# Patient Record
Sex: Female | Born: 1975 | Race: White | Hispanic: No | Marital: Married | Smoking: Never smoker
Health system: Southern US, Community
[De-identification: ages and names within clinical notes are randomized; demographics above are authoritative.]

## PROBLEM LIST (undated history)

## (undated) DIAGNOSIS — D649 Anemia, unspecified: Secondary | ICD-10-CM

## (undated) DIAGNOSIS — R079 Chest pain, unspecified: Secondary | ICD-10-CM

## (undated) HISTORY — PX: EYE SURGERY: SHX253

## (undated) HISTORY — DX: Chest pain, unspecified: R07.9

---

## 2015-05-04 ENCOUNTER — Other Ambulatory Visit: Payer: Self-pay | Admitting: Family Medicine

## 2015-05-04 DIAGNOSIS — Z1231 Encounter for screening mammogram for malignant neoplasm of breast: Secondary | ICD-10-CM

## 2015-05-06 ENCOUNTER — Ambulatory Visit: Payer: Self-pay

## 2015-05-07 ENCOUNTER — Ambulatory Visit
Admission: RE | Admit: 2015-05-07 | Discharge: 2015-05-07 | Disposition: A | Discharge status: Home / Self Care | Payer: BLUE CROSS/BLUE SHIELD | Source: Ambulatory Visit | Attending: Family Medicine | Admitting: Family Medicine

## 2015-05-07 DIAGNOSIS — Z1231 Encounter for screening mammogram for malignant neoplasm of breast: Secondary | ICD-10-CM | POA: Insufficient documentation

## 2015-08-01 ENCOUNTER — Encounter: Payer: Self-pay | Admitting: Gynecology

## 2015-08-01 ENCOUNTER — Ambulatory Visit
Admission: EM | Admit: 2015-08-01 | Discharge: 2015-08-01 | Disposition: A | Discharge status: Home / Self Care | Payer: BLUE CROSS/BLUE SHIELD | Attending: Family Medicine | Admitting: Family Medicine

## 2015-08-01 DIAGNOSIS — R079 Chest pain, unspecified: Secondary | ICD-10-CM | POA: Insufficient documentation

## 2015-08-01 DIAGNOSIS — R5383 Other fatigue: Secondary | ICD-10-CM | POA: Diagnosis not present

## 2015-08-01 DIAGNOSIS — R531 Weakness: Secondary | ICD-10-CM | POA: Diagnosis present

## 2015-08-01 DIAGNOSIS — F419 Anxiety disorder, unspecified: Secondary | ICD-10-CM | POA: Diagnosis not present

## 2015-08-01 DIAGNOSIS — R42 Dizziness and giddiness: Secondary | ICD-10-CM | POA: Diagnosis present

## 2015-08-01 HISTORY — DX: Anemia, unspecified: D64.9

## 2015-08-01 LAB — COMPREHENSIVE METABOLIC PANEL
ALK PHOS: 35 U/L — AB (ref 38–126)
ALT: 55 U/L — ABNORMAL HIGH (ref 14–54)
AST: 57 U/L — AB (ref 15–41)
Albumin: 4.7 g/dL (ref 3.5–5.0)
Anion gap: 13 (ref 5–15)
BILIRUBIN TOTAL: 0.2 mg/dL — AB (ref 0.3–1.2)
BUN: 14 mg/dL (ref 6–20)
CALCIUM: 9.2 mg/dL (ref 8.9–10.3)
CO2: 26 mmol/L (ref 22–32)
Chloride: 100 mmol/L — ABNORMAL LOW (ref 101–111)
Creatinine, Ser: 0.54 mg/dL (ref 0.44–1.00)
GFR calc Af Amer: >60 mL/min (ref >60)
GFR calc non Af Amer: >60 mL/min (ref >60)
GLUCOSE: 100 mg/dL — AB (ref 65–99)
POTASSIUM: 3.6 mmol/L (ref 3.5–5.1)
SODIUM: 139 mmol/L (ref 135–145)
TOTAL PROTEIN: 8.2 g/dL — AB (ref 6.5–8.1)

## 2015-08-01 LAB — CBC WITH DIFFERENTIAL/PLATELET
BASOS ABS: 0 10*3/uL (ref 0–0.1)
Basophils Relative: 0 %
EOS ABS: 0.1 10*3/uL (ref 0–0.7)
Eosinophils Relative: 2 %
HEMATOCRIT: 39.4 % (ref 35.0–47.0)
Hemoglobin: 13 g/dL (ref 12.0–16.0)
Lymphocytes Relative: 30 %
Lymphs Abs: 1.9 10*3/uL (ref 1.0–3.6)
MCH: 26.3 pg (ref 26.0–34.0)
MCHC: 33 g/dL (ref 32.0–36.0)
MCV: 79.9 fL — ABNORMAL LOW (ref 80.0–100.0)
Monocytes Absolute: 0.5 10*3/uL (ref 0.2–0.9)
Monocytes Relative: 7 %
NEUTROS ABS: 3.8 10*3/uL (ref 1.4–6.5)
Neutrophils Relative %: 61 %
Platelets: 268 10*3/uL (ref 150–440)
RBC: 4.93 MIL/uL (ref 3.80–5.20)
RDW: 26.4 % — ABNORMAL HIGH (ref 11.5–14.5)
WBC: 6.3 10*3/uL (ref 3.6–11.0)

## 2015-08-01 NOTE — ED Notes (Signed)
Pt stated feeling faint, weak, dizzy starting last night. Anemic.

## 2015-08-01 NOTE — ED Provider Notes (Signed)
CSN: [id number]     Arrival date & time 08/01/15  1227 History   First MD Initiated Contact with Patient 08/01/15 1353     Chief Complaint  Patient presents with  . Dizziness  . Weakness   (Consider location/radiation/quality/duration/timing/severity/associated sxs/prior Treatment) HPI Comments: 39 yo female with a h/o fatigue, weakness, intermittent dizziness for weeks, intermittent chest pains,  worse last night. States has discussed with PCP recently and per patient discussed possibility of depression/anxiety. Patient states she's been under a lot of stress. Denies fevers, chills, shortness of breath, rash.   Patient is a 39 y.o. female presenting with dizziness and weakness. The history is provided by the patient.  Dizziness Associated symptoms: weakness   Weakness    Past Medical History  Diagnosis Date  . Anemia    Past Surgical History  Procedure Laterality Date  . Cesarean section      2    No family history on file. Social History  Substance Use Topics  . Smoking status: Never Smoker   . Smokeless tobacco: None  . Alcohol Use: No   OB History    No data available     Review of Systems  Neurological: Positive for dizziness and weakness.    Allergies  Zithromax  Home Medications   Prior to Admission medications   Medication Sig Start Date End Date Taking? Authorizing Payton Moder  IRON CR PO Take by mouth.   Yes Historical Maricruz Lucero, MD   Meds Ordered and Administered this Visit  Medications - No data to display  BP 151/93 mmHg  Pulse 75  Temp(Src) 98.6 F (37 C) (Tympanic)  Ht  (1.651 m)  Wt 140 lb (63.504 kg)  BMI 23.30 kg/m2  SpO2 100%  LMP 07/25/2015 Orthostatic VS for the past 24 hrs:  BP- Lying Pulse- Lying BP- Sitting Pulse- Sitting BP- Standing at 0 minutes Pulse- Standing at 0 minutes  08/01/15 1357 (!) 143/93 mmHg 71 (!) 154/94 mmHg 68 (!) 131/97 mmHg 77    Physical Exam  Constitutional: She appears well-developed and  well-nourished. No distress.  HENT:  Head: Normocephalic.  Right Ear: Tympanic membrane, external ear and ear canal normal.  Left Ear: Tympanic membrane, external ear and ear canal normal.  Nose: Nose normal.  Mouth/Throat: Oropharynx is clear and moist and mucous membranes are normal.  Eyes: Conjunctivae and EOM are normal. Pupils are equal, round, and reactive to light. Right eye exhibits no discharge. Left eye exhibits no discharge. No scleral icterus.  Neck: Normal range of motion. Neck supple. No JVD present. No tracheal deviation present. No thyromegaly present.  Cardiovascular: Normal rate, regular rhythm, normal heart sounds and intact distal pulses.   No murmur heard. Pulmonary/Chest: Effort normal and breath sounds normal. No stridor. No respiratory distress. She has no wheezes. She has no rales. She exhibits no tenderness.  Musculoskeletal: She exhibits no edema.  Lymphadenopathy:    She has no cervical adenopathy.  Neurological: She is alert.  Skin: No rash noted. She is not diaphoretic.  Vitals reviewed.   ED Course  Procedures (including critical care time)  Labs Review Labs Reviewed  CBC WITH DIFFERENTIAL/PLATELET - Abnormal; Notable for the following:    MCV 79.9 (*)    RDW 26.4 (*)    All other components within normal limits  COMPREHENSIVE METABOLIC PANEL - Abnormal; Notable for the following:    Chloride 100 (*)    Glucose, Bld 100 (*)    Total Protein 8.2 (*)  AST 57 (*)    ALT 55 (*)    Alkaline Phosphatase 35 (*)    Total Bilirubin 0.2 (*)    All other components within normal limits    Imaging Review No results found.   Visual Acuity Review  Right Eye Distance:   Left Eye Distance:   Bilateral Distance:    Right Eye Near:   Left Eye Near:    Bilateral Near:      EKG: normal EKG, normal sinus rhythm, there are no previous tracings available for comparison; reviewed by me and agree with printout.   MDM   1. Anxiety   2. Other fatigue     Plan: 1. Test results and diagnosis reviewed with patient 2  Discussed trial of zoloft, however patient refuses; recommend follow up with PCP for further management/discussion of treatment for possible depression/anxietysupportive treatment with  3.. F/u prn    Payton Mccallum, MD 08/01/15 1621

## 2017-08-16 ENCOUNTER — Other Ambulatory Visit: Payer: Self-pay | Admitting: Family Medicine

## 2017-08-16 DIAGNOSIS — Z1231 Encounter for screening mammogram for malignant neoplasm of breast: Secondary | ICD-10-CM

## 2017-09-14 ENCOUNTER — Ambulatory Visit
Admission: RE | Admit: 2017-09-14 | Discharge: 2017-09-14 | Disposition: A | Discharge status: Home / Self Care | Payer: 59 | Source: Ambulatory Visit | Attending: Family Medicine | Admitting: Family Medicine

## 2017-09-14 DIAGNOSIS — Z1231 Encounter for screening mammogram for malignant neoplasm of breast: Secondary | ICD-10-CM | POA: Insufficient documentation

## 2021-07-23 ENCOUNTER — Other Ambulatory Visit: Payer: Self-pay

## 2021-07-23 ENCOUNTER — Ambulatory Visit (LOCAL_COMMUNITY_HEALTH_CENTER): Payer: 59

## 2021-07-23 DIAGNOSIS — R7611 Nonspecific reaction to tuberculin skin test without active tuberculosis: Secondary | ICD-10-CM

## 2021-07-23 NOTE — Progress Notes (Signed)
As no PPD administered, PPDR appt for 07/26/2021 cancelled. Jossie Ng, RN

## 2021-07-26 ENCOUNTER — Other Ambulatory Visit: Payer: Self-pay

## 2021-08-01 ENCOUNTER — Ambulatory Visit (HOSPITAL_COMMUNITY)
Admission: EM | Admit: 2021-08-01 | Discharge: 2021-08-01 | Disposition: A | Discharge status: Home / Self Care | Payer: 59 | Attending: Internal Medicine | Admitting: Internal Medicine

## 2021-08-01 ENCOUNTER — Other Ambulatory Visit: Payer: Self-pay

## 2021-08-01 DIAGNOSIS — Z20822 Contact with and (suspected) exposure to covid-19: Secondary | ICD-10-CM | POA: Insufficient documentation

## 2021-08-01 LAB — SARS CORONAVIRUS 2 (TAT 6-24 HRS): SARS Coronavirus 2: NEGATIVE

## 2021-08-01 NOTE — ED Triage Notes (Signed)
COVID test for travel 

## 2022-02-21 ENCOUNTER — Encounter: Payer: Self-pay | Admitting: Emergency Medicine

## 2022-02-21 ENCOUNTER — Emergency Department
Admission: EM | Admit: 2022-02-21 | Discharge: 2022-02-21 | Discharge status: Against Medical Advice | Payer: 59 | Attending: Emergency Medicine | Admitting: Emergency Medicine

## 2022-02-21 ENCOUNTER — Other Ambulatory Visit: Payer: Self-pay

## 2022-02-21 ENCOUNTER — Ambulatory Visit
Admission: EM | Admit: 2022-02-21 | Discharge: 2022-02-21 | Disposition: A | Discharge status: Home / Self Care | Payer: 59 | Attending: Physician Assistant | Admitting: Physician Assistant

## 2022-02-21 ENCOUNTER — Emergency Department: Payer: 59

## 2022-02-21 DIAGNOSIS — I10 Essential (primary) hypertension: Secondary | ICD-10-CM

## 2022-02-21 DIAGNOSIS — R079 Chest pain, unspecified: Secondary | ICD-10-CM | POA: Diagnosis not present

## 2022-02-21 DIAGNOSIS — I161 Hypertensive emergency: Secondary | ICD-10-CM

## 2022-02-21 DIAGNOSIS — R0789 Other chest pain: Secondary | ICD-10-CM | POA: Diagnosis present

## 2022-02-21 DIAGNOSIS — Z5329 Procedure and treatment not carried out because of patient's decision for other reasons: Secondary | ICD-10-CM | POA: Insufficient documentation

## 2022-02-21 LAB — URINALYSIS, ROUTINE W REFLEX MICROSCOPIC
Bilirubin Urine: NEGATIVE
Glucose, UA: NEGATIVE mg/dL
Hgb urine dipstick: NEGATIVE
Ketones, ur: NEGATIVE mg/dL
Leukocytes,Ua: NEGATIVE
Nitrite: NEGATIVE
Protein, ur: NEGATIVE mg/dL
Specific Gravity, Urine: 1.004 — ABNORMAL LOW (ref 1.005–1.030)
pH: 8 (ref 5.0–8.0)

## 2022-02-21 LAB — POC URINE PREG, ED: Preg Test, Ur: NEGATIVE

## 2022-02-21 LAB — CBC
HCT: 40.1 % (ref 36.0–46.0)
Hemoglobin: 12.6 g/dL (ref 12.0–15.0)
MCH: 26.3 pg (ref 26.0–34.0)
MCHC: 31.4 g/dL (ref 30.0–36.0)
MCV: 83.7 fL (ref 80.0–100.0)
Platelets: 260 10*3/uL (ref 150–400)
RBC: 4.79 MIL/uL (ref 3.87–5.11)
RDW: 15.8 % — ABNORMAL HIGH (ref 11.5–15.5)
WBC: 7.3 10*3/uL (ref 4.0–10.5)
nRBC: 0 % (ref 0.0–0.2)

## 2022-02-21 LAB — BASIC METABOLIC PANEL
Anion gap: 9 (ref 5–15)
BUN: 13 mg/dL (ref 6–20)
CO2: 27 mmol/L (ref 22–32)
Calcium: 9.4 mg/dL (ref 8.9–10.3)
Chloride: 104 mmol/L (ref 98–111)
Creatinine, Ser: 0.49 mg/dL (ref 0.44–1.00)
GFR, Estimated: >60 mL/min (ref >60)
Glucose, Bld: 106 mg/dL — ABNORMAL HIGH (ref 70–99)
Potassium: 3.5 mmol/L (ref 3.5–5.1)
Sodium: 140 mmol/L (ref 135–145)

## 2022-02-21 LAB — TROPONIN I (HIGH SENSITIVITY)
Troponin I (High Sensitivity): 6 ng/L (ref <18)
Troponin I (High Sensitivity): 7 ng/L (ref <18)

## 2022-02-21 MED ORDER — AMLODIPINE BESYLATE 5 MG PO TABS
10.0000 mg | ORAL_TABLET | Freq: Once | ORAL | Status: AC
  Administered 2022-02-21: 10 mg via ORAL
  Filled 2022-02-21: qty 2

## 2022-02-21 MED ORDER — AMLODIPINE BESYLATE 10 MG PO TABS: 10.0000 mg | ORAL_TABLET | Freq: Every day | ORAL | 11 refills | Status: DC

## 2022-02-21 MED ORDER — ASPIRIN EC 81 MG PO TBEC: 81.0000 mg | DELAYED_RELEASE_TABLET | Freq: Every day | ORAL | 0 refills | Status: AC

## 2022-02-21 MED ORDER — ASPIRIN 81 MG PO CHEW
324.0000 mg | CHEWABLE_TABLET | Freq: Once | ORAL | Status: AC
  Administered 2022-02-21: 324 mg via ORAL
  Filled 2022-02-21: qty 4

## 2022-02-21 NOTE — ED Provider Notes (Addendum)
? ? ?[facility] ?Provider Note ? ? ? Event Date/Time  ? First MD Initiated Contact with Patient 02/21/22 2044   ?  (approximate) ? ? ?History  ? ?Chest Pain ? ? ?HPI ? ?Misty Glover is a 46 y.o. female with past medical history of hypertension who presents with chest pain.  Patient has had been having chest pain on and off for several months.  Thinks it is brought on by stress.  However today it has been fairly constant.  Described as a burning like sensation in the left axilla and left anterior chest.  Not worse with exertion or inspiration.  Denies associated dyspnea nausea vomiting or diaphoresis.  Denies history of prior heart attack. The patient denies hx of prior DVT/PE, unilateral leg pain/swelling, hormone use, recent surgery, hx of cancer, prolonged immobilization, or hemoptysis.  Patient was told she had high blood pressure several months ago but was not started on medication. ? ?  ? ?Past Medical History:  ?Diagnosis Date  ? Anemia   ? ? ?There are no problems to display for this patient. ? ? ? ?Physical Exam  ?Triage Vital Signs: ?ED Triage Vitals  ?Enc Vitals Group  ?   BP 02/21/22 1922 (!) 230/112  ?   Pulse Rate 02/21/22 1922 79  ?   Resp 02/21/22 1922 20  ?   Temp 02/21/22 1922 98.2 ?F (36.8 ?C)  ?   Temp Source 02/21/22 1922 Oral  ?   SpO2 02/21/22 1922 100 %  ?   Weight 02/21/22 1918 160 lb (72.6 kg)  ?   Height 02/21/22 1918 5\' 5"  (1.651 m)  ?   Head Circumference --   ?   Peak Flow --   ?   Pain Score 02/21/22 1918 2  ?   Pain Loc --   ?   Pain Edu? --   ?   Excl. in [place]? --   ? ? ?Most recent vital signs: ?Vitals:  ? 02/21/22 1922 02/21/22 2145  ?BP: (!) 230/112 (!) 176/101  ?Pulse: 79 64  ?Resp: 20 18  ?Temp: 98.2 ?F (36.8 ?C)   ?SpO2: 100% 98%  ? ? ? ?General: Awake, no distress.  ?CV:  Good peripheral perfusion.  No lower extremity edema ?Resp:  Normal effort.  Lungs are clear ?Abd:  No distention.  ?Neuro:             Awake, Alert, Oriented x 3  ?Other:   ? ? ?ED  Results / Procedures / Treatments  ?Labs ?(all labs ordered are listed, but only abnormal results are displayed) ?Labs Reviewed  ?BASIC METABOLIC PANEL - Abnormal; Notable for the following components:  ?    Result Value  ? Glucose, Bld 106 (*)   ? All other components within normal limits  ?CBC - Abnormal; Notable for the following components:  ? RDW 15.8 (*)   ? All other components within normal limits  ?URINALYSIS, ROUTINE W REFLEX MICROSCOPIC - Abnormal; Notable for the following components:  ? Color, Urine STRAW (*)   ? APPearance CLEAR (*)   ? Specific Gravity, Urine 1.004 (*)   ? All other components within normal limits  ?POC URINE PREG, ED  ?TROPONIN I (HIGH SENSITIVITY)  ?TROPONIN I (HIGH SENSITIVITY)  ? ? ? ?EKG ? ?EKG interpreted by myself, normal sinus rhythm normal axis normal intervals, subtle ST depression in the inferior leads not present on prior EKG ? ? ?RADIOLOGY ?I reviewed the CXR which does  not show any acute cardiopulmonary process; agree with radiology report  ? ? ? ?PROCEDURES: ? ?Critical Care performed:  ? ?Procedures ? ? ?MEDICATIONS ORDERED IN ED: ?Medications  ?aspirin chewable tablet 324 mg (has no administration in time range)  ?amLODipine (NORVASC) tablet 10 mg (has no administration in time range)  ? ? ? ?IMPRESSION / MDM / ASSESSMENT AND PLAN / ED COURSE  ?I reviewed the triage vital signs and the nursing notes. ?             ?               ? ?Differential diagnosis includes, but is not limited to, ACS, hypertensive emergency, GERD, musculoskeletal, pulmonary embolism ?Patient is a 46 year old female is otherwise healthy although does have a prior diagnosis of hypertension has not been treated presenting with chest pain.  Has been having this on and off for several months but is been rather constant today.  Described as a burning-like sensation without associated symptoms.  Seen at urgent care initially referred to the ED.  Initial blood pressure was quite elevated 230/112 rest  of vital signs within normal limits.  She is appears well.  Does have ongoing symptoms.  Her EKG does have some ST depressions in the inferior leads should not present on prior EKG.  Overall her symptoms do not sound cardiac to me however with this abnormal EKG and her significant hypertension do feel like she would benefit from admission for further cardiac work-up.  Initial troponin is negative.  She is not hypoxic or tachycardia my suspicion for PE is low.  Clinically do not suspect aortic dissection. ? ?Patient second troponin is negative.  Repeat blood pressure 170/100.  Had a long discussion with the patient regarding my concern about her EKG elevated blood pressure and ongoing chest pain.  Despite the 2 negative troponins I think she is high risk for cardiac ischemia and I did recommend admission for further work-up.  Patient ultimately did not want to stay.  We discussed risk of completing heart attack at home with worst-case scenario of sudden cardiac death, and she understood.  Discussed with Dr. Graciela Husbands with cardiology in hopes of getting her close follow-up.  He also expressed concern about her significantly elevated blood pressure.  Ultimately decided to leave AMA.  I prescribed her amlodipine and aspirin, she received 324 in the ED and I recommended she started taking a baby aspirin daily.  She plans to call cardiology in the a.m. ?  ? ? ?FINAL CLINICAL IMPRESSION(S) / ED DIAGNOSES  ? ?Final diagnoses:  ?Chest pain, unspecified type  ?Hypertension, unspecified type  ? ? ? ?Rx / DC Orders  ? ?ED Discharge Orders   ? ?      Ordered  ?  amLODipine (NORVASC) 10 MG tablet  Daily       ? 02/21/22 2257  ?  aspirin EC 81 MG tablet  Daily       ? 02/21/22 2257  ? ?  ?  ? ?  ? ? ? ?Note:  This document was prepared using Dragon voice recognition software and may include unintentional dictation errors. ?  ?Georga Hacking, MD ?02/21/22 2300 ? ?  ?Georga Hacking, MD ?02/21/22 2312 ? ?

## 2022-02-21 NOTE — ED Triage Notes (Signed)
Pt presents via POV with complaints of left sided chest pain that has been occurring for the last 2 weeks intermittently but today the pain has been constant. She notes not taking any pain medications but has taken OTC supplements without any improvement. Denies SOB.  ?

## 2022-02-21 NOTE — ED Triage Notes (Signed)
Pt c/o left sided chest pain. Started about 3-4 weeks ago. States it is becoming more frequency and today it has not gone away. She states she can tell the pain is worse when she is at work because the pain typically goes away when she is off work. Denies shortness of breath, sweats, n/v, left arm or jaw pain. She states her pcp told her to come to urgent care. Pt has been taking 2 supplements for HTN, given by a holistic doctor.  ?

## 2022-02-21 NOTE — Discharge Instructions (Signed)

## 2022-02-21 NOTE — ED Notes (Signed)
Patient is being discharged from the Urgent Care and sent to the Emergency Department via POV . Per Adrian Saran, PA, patient is in need of higher level of care due to Chest pain. Patient is aware and verbalizes understanding of plan of care.  ?Vitals:  ? 02/21/22 1656  ?BP: (!) 200/106  ?Pulse: 76  ?Resp: 18  ?Temp: 98.6 ?F (37 ?C)  ?SpO2: 97%  ? ? ?

## 2022-02-21 NOTE — ED Provider Notes (Signed)
?[place] ? ? ? ?CSN: [id number] ?Arrival date & time: 02/21/22  1633 ? ? ?  ? ?History   ?Chief Complaint ?Chief Complaint  ?Patient presents with  ? Chest Pain  ? ? ?HPI ?Misty Glover is a 46 y.o. female presenting for approximately 7 to 8-hour history of left sided chest pressure, aching and burning which she rates at 2/10.  Patient reports it radiates to the axilla.  No radiation of pain to neck, shoulder or arm.  Denies associated numbness, tingling or weakness.  No breathing difficulty or wheezing.  Denies headaches, diaphoresis, nausea/vomiting.  No injuries reported.  Patient reports she has had the symptoms off and on for the past 3 to 4 weeks.  She says she has noticed the symptoms when she has been at work.  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  She says both are stressful.  She has noticed her symptoms will come on when she was stressed or active at work.  Reports on her days off she does not have the symptoms as much except for today when they have came and not gone away.  She says they normally go away.  She does have history of hypertension and states she saw her PCP about 4 months ago when her blood pressure was in the [id number] systolic.  She says her PCP advised she start blood pressure medication but she was not ready to.  Reports she tried to lose weight and change her diet thinking that would help.  Patient has not checked her blood pressure recently.  BP is 200/106 in clinic today.  Patient denies any history of hyperlipidemia or diabetes.  No history of cardiovascular problems.  Denies tobacco or alcohol use.  No other complaints today. ? ?HPI ? ?Past Medical History:  ?Diagnosis Date  ? Anemia   ? ? ?There are no problems to display for this patient. ? ? ?Past Surgical History:  ?Procedure Laterality Date  ? CESAREAN SECTION    ? 2   ? EYE SURGERY    ? ? ?OB History   ?No obstetric history on file. ?  ? ? ? ?Home Medications   ? ?Prior to Admission  medications   ?Medication Sig Start Date End Date Taking? Authorizing Provider  ?IRON CR PO Take by mouth.    [provider]  ? ? ?Family History ?Family History  ?Problem Relation Age of Onset  ? Breast cancer Neg Hx   ? ? ?Social History ?Social History  ? ?Tobacco Use  ? Smoking status: Never  ? Smokeless tobacco: Never  ?Vaping Use  ? Vaping Use: Never used  ?Substance Use Topics  ? Alcohol use: No  ? Drug use: No  ? ? ? ?Allergies   ?Zithromax [azithromycin] ? ? ?Review of Systems ?Review of Systems  ?Constitutional:  Negative for diaphoresis and fatigue.  ?Respiratory:  Negative for cough and shortness of breath.   ?Cardiovascular:  Positive for chest pain. Negative for palpitations and leg swelling.  ?Gastrointestinal:  Negative for abdominal pain, nausea and vomiting.  ?Musculoskeletal:  Negative for back pain and gait problem.  ?Neurological:  Negative for dizziness, syncope, facial asymmetry, speech difficulty, weakness, numbness and headaches.  ?Psychiatric/Behavioral:  The patient is nervous/anxious.   ? ? ?Physical Exam ?Triage Vital Signs ?ED Triage Vitals  ?Enc Vitals Group  ?   BP   ?   Pulse   ?   Resp   ?  Temp   ?   Temp src   ?   SpO2   ?   Weight   ?   Height   ?   Head Circumference   ?   Peak Flow   ?   Pain Score   ?   Pain Loc   ?   Pain Edu?   ?   Excl. in [place]?   ? ?No data found. ? ?Updated Vital Signs ?BP (!) 200/106 (BP Location: Left Arm)   Pulse 76   Temp 98.6 ?F (37 ?C) (Oral)   Resp 18   Ht 5\' 5"  (1.651 m)   Wt 139 lb 15.9 oz (63.5 kg)   SpO2 97%   BMI 23.30 kg/m?  ? ? ? ?Physical Exam ?Vitals and nursing note reviewed.  ?Constitutional:   ?   General: She is not in acute distress. ?   Appearance: Normal appearance. She is not ill-appearing or toxic-appearing.  ?HENT:  ?   Head: Normocephalic and atraumatic.  ?   Nose: Nose normal.  ?   Mouth/Throat:  ?   Mouth: Mucous membranes are moist.  ?   Pharynx: Oropharynx is clear.  ?Eyes:  ?   General: No scleral icterus.     ?   Right eye: No discharge.     ?   Left eye: No discharge.  ?   Conjunctiva/sclera: Conjunctivae normal.  ?Cardiovascular:  ?   Rate and Rhythm: Normal rate and regular rhythm.  ?   Heart sounds: Normal heart sounds.  ?Pulmonary:  ?   Effort: Pulmonary effort is normal. No respiratory distress.  ?   Breath sounds: Normal breath sounds.  ?Musculoskeletal:  ?   Cervical back: Neck supple.  ?Skin: ?   General: Skin is dry.  ?Neurological:  ?   General: No focal deficit present.  ?   Mental Status: She is alert. Mental status is at baseline.  ?   Motor: No weakness.  ?   Gait: Gait normal.  ?Psychiatric:     ?   Mood and Affect: Mood normal.     ?   Behavior: Behavior normal.     ?   Thought Content: Thought content normal.  ? ? ? ?UC Treatments / Results  ?Labs ?(all labs ordered are listed, but only abnormal results are displayed) ?Labs Reviewed - No data to display ? ?EKG ? ? ?Radiology ?No results found. ? ?Procedures ?ED EKG ? ?Date/Time: 02/21/2022 5:40 PM ?Performed by: Danton Clap, PA-C ?Authorized by: Danton Clap, PA-C  ? ?ECG reviewed by ED Physician in the absence of a cardiologist: yes   ?Previous ECG:  ?  Previous ECG:  Unavailable ?Interpretation:  ?  Interpretation: normal   ?Rate:  ?  ECG rate:  72 ?  ECG rate assessment: normal   ?Rhythm:  ?  Rhythm: sinus rhythm   ?Ectopy:  ?  Ectopy: none   ?QRS:  ?  QRS axis:  Normal ?  QRS intervals:  Normal ?  QRS conduction: normal   ?ST segments:  ?  ST segments:  Normal ?T waves:  ?  T waves: normal   ?Comments:  ?   Normal sinus rhythm. Regular rate. (including critical care time) ? ?Medications Ordered in UC ?Medications - No data to display ? ?Initial Impression / Assessment and Plan / UC Course  ?I have reviewed the triage vital signs and the nursing notes. ? ?Pertinent labs & imaging results that were  available during my care of the patient were reviewed by me and considered in my medical decision making (see chart for details). ? ?  ?46 year old  female presenting for left-sided chest pressure and burning sensation which she rates at 2 out of 10 over the past 7 to 8 hours.  Pain has been constant.  It does radiate to the axillary region.  Not associated with numbness, tingling or weakness.  No breathing difficulty, diaphoresis, nausea or vomiting or dizziness.  History of hypertension.  Blood pressure in clinic is 200/106.  Patient reports increased chest discomfort when she is stressed or active at work.  EKG performed today shows normal sinus rhythm and regular rate.  No obvious ST or T wave changes.  Discussed the result with patient.  Her exam is reassuring today as is her EKG but I am very concerned about her blood pressure being so high with the complaint of left-sided chest pain.  Advised her I would feel more comfortable if she were to go to the emergency department to be evaluated at this time and have cardiac enzymes drawn and potentially more imaging and further work-up.  I do not feel comfortable ruling out cardiovascular cause especially given her hypertensive emergency.  Patient seems reluctant to go to ED but says she will have her husband take her.  Does not show interest in EMS transport.  Patient is leaving in stable condition at this time. ? ?Final Clinical Impressions(s) / UC Diagnoses  ? ?Final diagnoses:  ?Chest pain, unspecified type  ?Hypertensive emergency  ? ? ? ?Discharge Instructions   ? ?  ?You have been advised to follow up immediately in the emergency department for concerning signs.symptoms. If you declined EMS transport, please have a family member take you directly to the ED at this time. Do not delay. Based on concerns about condition, if you do not follow up in th e ED, you may risk poor outcomes including worsening of condition, delayed treatment and potentially life threatening issues. If you have declined to go to the ED at this time, you should call your PCP immediately to set up a follow up appointment. ? ?Go to ED  for red flag symptoms, including; fevers you cannot reduce with Tylenol/Motrin, severe headaches, vision changes, numbness/weakness in part of the body, lethargy, confusion, intractable vomiting, severe dehydrat

## 2022-02-21 NOTE — Discharge Instructions (Signed)
Your blood pressure was significantly elevated today.  Please start taking amlodipine daily or what ever medication your cardiologist or primary care provider recommend.  Please start taking 81 mg of aspirin daily. ? ?Call the cardiologist office tomorrow to schedule an appointment.  Please also call your primary care provider to schedule an appointment for discussion of management of your blood pressure. ?

## 2022-02-24 ENCOUNTER — Encounter: Payer: Self-pay | Admitting: Internal Medicine

## 2022-02-24 ENCOUNTER — Ambulatory Visit: Payer: 59 | Admitting: Internal Medicine

## 2022-02-24 VITALS — BP 128/72 | HR 75 | Ht 65.0 in | Wt 161.2 lb

## 2022-02-24 DIAGNOSIS — I10 Essential (primary) hypertension: Secondary | ICD-10-CM

## 2022-02-24 DIAGNOSIS — R072 Precordial pain: Secondary | ICD-10-CM | POA: Diagnosis not present

## 2022-02-24 MED ORDER — AMLODIPINE BESYLATE 5 MG PO TABS: 5.0000 mg | ORAL_TABLET | Freq: Every day | ORAL | 3 refills | Status: AC

## 2022-02-24 MED ORDER — METOPROLOL TARTRATE 100 MG PO TABS: 100.0000 mg | ORAL_TABLET | Freq: Once | ORAL | 0 refills | Status: AC

## 2022-02-24 NOTE — Progress Notes (Signed)
?Cardiology Office Note:   ? ?Date:  02/24/2022  ? ?ID:  Misty Glover, DOB 02/12/1976, MRN [id number] ? ?PCP:  Etheleen Nicks, NP  ? ?[facility] Providers ?Cardiologist:  Alverda Skeans, MD ?Referring MD: Etheleen Nicks, NP  ? ?Chief Complaint/Reason for Referral: Chest pain ? ?ASSESSMENT:   ? ?1. Precordial pain   ?2. Hypertension, unspecified type   ? ? ?PLAN:   ? ?In order of problems listed above: ?We will obtain a coronary CTA to evaluate further.  If the patient has mild obstructive coronary artery disease, they will require a statin (with goal LDL < 70) and aspirin, if they have high-grade disease we will need to consider optimal medical therapy and if symptoms are refractory to medical therapy, then a cardiac catheterization with possible PCI will be pursued to alleviate symptoms.  If they have high risk disease we will proceed directly to cardiac catheterization.  Follow up PRN.  We will stop her aspirin as she has no documented coronary artery disease, diabetes, peripheral vascular disease, or history of stroke. ?2.  Blood pressures well controlled continue Norvasc 5 mg. ? ? ?     ? ?   ? ?Dispo:  Return if symptoms worsen or fail to improve.  ? ?  ? ?Medication Adjustments/Labs and Tests Ordered: ?Current medicines are reviewed at length with the patient today.  Concerns regarding medicines are outlined above. ? ?The following changes have been made:   ? ?Labs/tests ordered: ?Orders Placed This Encounter  ?Procedures  ? CT CORONARY MORPH W/CTA COR W/SCORE W/CA W/CM &/OR WO/CM  ? ? ?Medication Changes: ?Meds ordered this encounter  ?Medications  ? amLODipine (NORVASC) 5 MG tablet  ?  Sig: Take 1 tablet (5 mg total) by mouth daily.  ?  Dispense:  90 tablet  ?  Refill:  3  ? metoprolol tartrate (LOPRESSOR) 100 MG tablet  ?  Sig: Take 1 tablet (100 mg total) by mouth once for 1 dose. Take 90-120 minutes prior to scan.  ?  Dispense:  1 tablet  ?  Refill:  0  ? ? ? ?Current medicines are reviewed at  length with the patient today.  The patient does not have concerns regarding medicines. ? ? ?History of Present Illness:   ? ?FOCUSED PROBLEM LIST:   ?1.  Hypertension ? ?The patient is a 46 y.o. female with the indicated medical history here for chest pain.  Patient presented to [facility] emergency department a few days ago with complaints of several months of variable chest pain brought on by stress.  Apparently they of presentation it was constant and felt like a burning in her chest her left axilla and left anterior chest.  Was not noted to be worse with exertion or inspiration.  EKG, labs, cardiac biomarkers, and chest x-ray are overall reassuring.  She was noted to be quite hypertensive in the emergency department with a blood pressure of over 200.  The patient did leave the emergency department AMA.  She is here for follow-up regarding chest pain and hypertension.  She tells me she gets chest pain when she exerts herself on occasion.  Oftentimes this chest pain is exacerbated by stress at times.  She denies any shortness of breath.  She has had no presyncope or syncope, she is denied any palpitations or paroxysmal nocturnal dyspnea.  She has had no severe bleeding or bruising.  She has never had a stroke.  She denies a family history of coronary artery disease.  She does not drink. ? 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  She feels like this is may be a mistake due to the stress imposed upon her.  She also has some stress related to her marriage as well. ? ?   ?Current Medications: ?Current Meds  ?Medication Sig  ? amLODipine (NORVASC) 5 MG tablet Take 1 tablet (5 mg total) by mouth daily.  ? aspirin EC 81 MG tablet Take 1 tablet (81 mg total) by mouth daily. Swallow whole.  ? IRON CR PO Take by mouth.  ? metoprolol tartrate (LOPRESSOR) 100 MG tablet Take 1 tablet (100 mg total) by mouth once for  1 dose. Take 90-120 minutes prior to scan.  ? [DISCONTINUED] amLODipine (NORVASC) 10 MG tablet Take 1 tablet (10 mg total) by mouth daily.  ?  ? ?Allergies:    ?Zithromax [azithromycin]  ? ?Social History:   ?Social History  ? ?Tobacco Use  ? Smoking status: Never  ? Smokeless tobacco: Never  ?Vaping Use  ? Vaping Use: Never used  ?Substance Use Topics  ? Alcohol use: No  ? Drug use: No  ?  ? ?Family Hx: ?Family History  ?Problem Relation Age of Onset  ? Breast cancer Neg Hx   ?  ? ?Review of Systems:   ?Please see the history of present illness.    ?All other systems reviewed and are negative. ?  ? ? ?EKGs/Labs/Other Test Reviewed:   ? ?EKG:  EKG performed February 21, 2022 that I personally reviewed demonstrates sinus rhythm no acute ST changes or Q waves ? ?Prior CV studies: ?None available ?  ? ?Other studies Reviewed: ?Review of the additional studies/records demonstrates: Chest x-ray demonstrates no acute cardiopulmonary disease ? ?Recent Labs: ?02/21/2022: BUN 13; Creatinine, Ser 0.49; Hemoglobin 12.6; Platelets 260; Potassium 3.5; Sodium 140  ? ?Recent Lipid Panel ?No results found for: CHOL, TRIG, HDL, LDLCALC, LDLDIRECT ? ?Risk Assessment/Calculations:   ? ? ?    ? ?Physical Exam:   ? ?VS:  BP 128/72   Pulse 75   Ht 5\' 5"  (1.651 m)   Wt 161 lb 3.2 oz (73.1 kg)   SpO2 98%   BMI 26.83 kg/m?    ?Wt Readings from Last 3 Encounters:  ?02/24/22 161 lb 3.2 oz (73.1 kg)  ?02/21/22 160 lb (72.6 kg)  ?02/21/22 139 lb 15.9 oz (63.5 kg)  ?  ?GENERAL:  No apparent distress, AOx3 ?HEENT:  No carotid bruits, +2 carotid impulses, no scleral icterus ?CAR: RRR no murmurs, gallops, rubs, or thrills ?RES:  Clear to auscultation bilaterally ?ABD:  Soft, nontender, nondistended, positive bowel sounds x 4 ?VASC:  +2 radial pulses, +2 carotid pulses, palpable pedal pulses ?NEURO:  CN 2-12 grossly intact; motor and sensory grossly intact ?PSYCH:  No active depression or anxiety ?EXT:  No edema, ecchymosis, or  cyanosis ? ?Signed, ?04/23/22, MD  ?02/24/2022 1:33 PM    ?[facility] ?[address] [place], [place], [place]  [place] ?Phone: [phone]; Fax: [phone]  ? ?Note:  This document was prepared using Dragon voice recognition software and may include unintentional dictation errors. ?

## 2022-02-24 NOTE — Patient Instructions (Signed)
Medication Instructions:  ?Your physician has recommended you make the following change in your medication:  ?1.) stop aspirin ?2.) decrease amlodipine (Norvasc) to 5 mg - one tablet daily ? ?*If you need a refill on your cardiac medications before your next appointment, please call your pharmacy* ? ? ?Lab Work: ?none ? ? ?Testing/Procedures: ?Cardiac CTA - see instructions below ? ? ?Follow-Up: ?As needed ? ? ? ? ?Your cardiac CT will be scheduled at  ?[facility] ?[address] ?[place], [place] [address] ?[phone] ? ?Please arrive at the [facility] and Children's Entrance (Entrance C2) of [facility] 30 minutes prior to test start time. ?You can use the FREE valet parking offered at entrance C (encouraged to control the heart rate for the test)  ?Proceed to the [facility] Radiology Department (first floor) to check-in and test prep. ? ?All radiology patients and guests should use entrance C2 at [facility], accessed from [facility], even though the hospital's physical address listed is [address] ? ? ? ?Please follow these instructions carefully (unless otherwise directed): ? ? ?On the Night Before the Test: ?Be sure to Drink plenty of water. ?Do not consume any caffeinated/decaffeinated beverages or chocolate 12 hours prior to your test. ?Do not take any antihistamines 12 hours prior to your test. ? ?On the Day of the Test: ?Drink plenty of water until 1 hour prior to the test. ?Do not eat any food 4 hours prior to the test. ?You may take your regular medications prior to the test.  ?Take metoprolol (Lopressor) two hours prior to test. ?FEMALES- please wear underwire-free bra if available, avoid dresses & tight clothing ? ?     ?After the Test: ?Drink plenty of water. ?After receiving IV contrast, you may experience a mild flushed feeling. This is normal. ?On occasion, you may experience a mild rash up to 24 hours after the test. This is not dangerous. If  this occurs, you can take Benadryl 25 mg and increase your fluid intake. ?If you experience trouble breathing, this can be serious. If it is severe call 911 IMMEDIATELY. If it is mild, please call our office. ?If you take any of these medications: Glipizide/Metformin, Avandament, Glucavance, please do not take 48 hours after completing test unless otherwise instructed. ? ?We will call to schedule your test 2-4 weeks out understanding that some insurance companies will need an authorization prior to the service being performed.  ? ?For non-scheduling related questions, please contact the cardiac imaging nurse navigator should you have any questions/concerns: ?Rockwell Alexandria, Cardiac Imaging Nurse Navigator ?Larey Brick, Cardiac Imaging Nurse Navigator ?[facility] ?Direct Office Dial: [phone]  ? ?For scheduling needs, including cancellations and rescheduling, please call [place], [phone]. ? ?Important Information About Sugar ? ? ? ? ?  ?

## 2022-02-24 NOTE — Telephone Encounter (Signed)
Will route to PharmD to confirm okay for patient to use ashwaganda gummy for stress. ?

## 2022-03-08 ENCOUNTER — Telehealth (HOSPITAL_COMMUNITY): Payer: Self-pay | Admitting: *Deleted

## 2022-03-08 NOTE — Telephone Encounter (Signed)
Reaching out to patient to offer assistance regarding upcoming cardiac imaging study; pt verbalizes understanding of appt date/time, parking situation and where to check in, pre-test NPO status and medications ordered, and verified current allergies; name and call back number provided for further questions should they arise ? ?Briza Bark RN Navigator Cardiac Imaging ?[facility] ?[phone] office ?[phone] cell ? ?Patient to take 100mg metoprolol tartrate two hours prior to her cardiac CT scan.  She is aware to arrive at 1:30pm. ? ?

## 2022-03-09 ENCOUNTER — Ambulatory Visit (HOSPITAL_COMMUNITY)
Admission: RE | Admit: 2022-03-09 | Discharge: 2022-03-09 | Disposition: A | Discharge status: Home / Self Care | Payer: 59 | Source: Ambulatory Visit | Attending: Internal Medicine | Admitting: Internal Medicine

## 2022-03-09 DIAGNOSIS — I10 Essential (primary) hypertension: Secondary | ICD-10-CM | POA: Diagnosis present

## 2022-03-09 DIAGNOSIS — R072 Precordial pain: Secondary | ICD-10-CM | POA: Diagnosis not present

## 2022-03-09 IMAGING — CT CT HEART MORP W/ CTA COR W/ SCORE W/ CA W/CM &/OR W/O CM
4 of 7 series · 8 of 20 positions shown, 9 images · non-contrast
Comparison: Chest two views [DATE]
COMPARISON: Chest two views [DATE]

Addendum:
EXAM:
OVER-READ INTERPRETATION  CT CHEST

The following report is an over-read performed by radiologist Dr.
RANA ADNAN [REDACTED] on [DATE]. This
over-read does not include interpretation of cardiac or coronary
anatomy or pathology. The coronary calcium score/coronary CTA
interpretation by the cardiologist is attached.
TECHNIQUE: The patient was scanned on a Phillips Force scanner.

[Series 6: ts diast sharp · axial · 0.39mm/px · z∈[+1404,+1444]mm · 2 of 301 slices shown]
[im 101/301  lung]
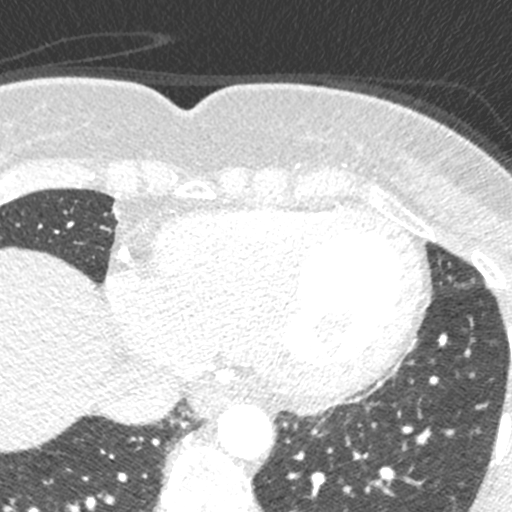
[im 201/301  lung]
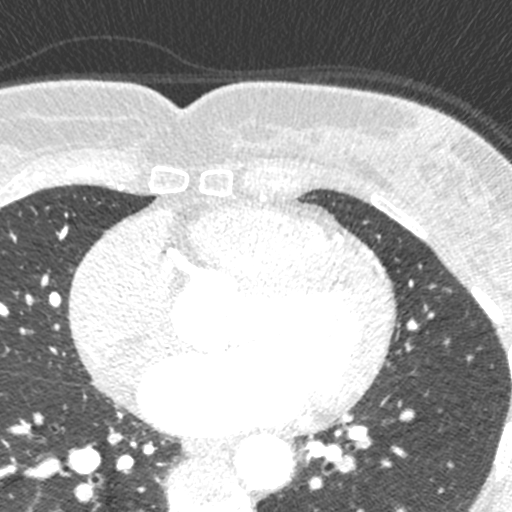

[Series 7: ts syst sharp · axial · 0.39mm/px · z∈[+1404,+1444]mm · 2 of 301 slices shown]
[im 101/301  lung]
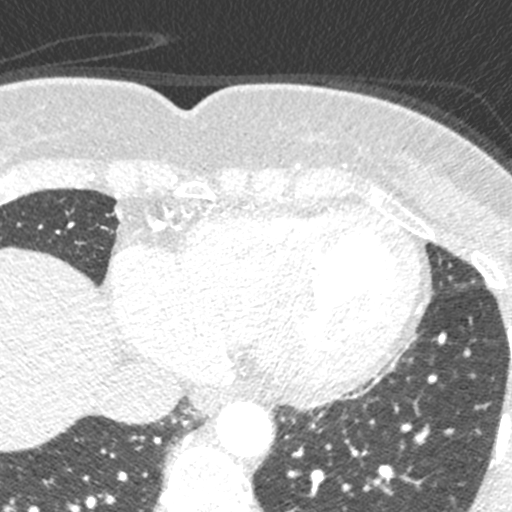
[im 201/301  lung]
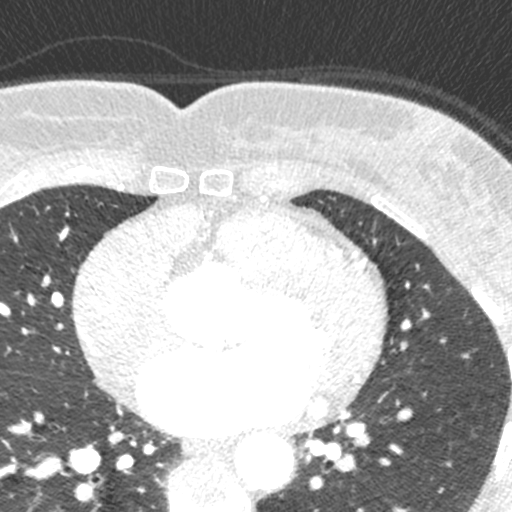

[Series 8: best syst · axial · 0.39mm/px · z∈[+1404,+1444]mm · 2 of 301 slices shown, 3 images]
[im 101/301  vessel]
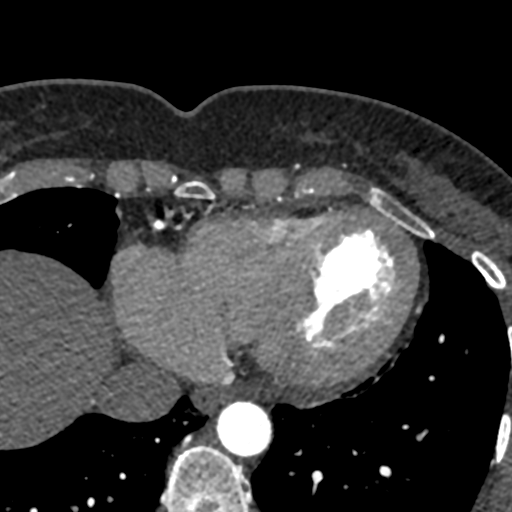
[im 101/301  lung]
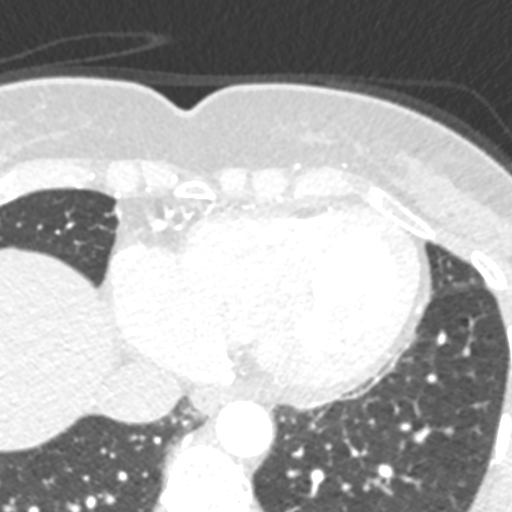
[im 201/301  vessel]
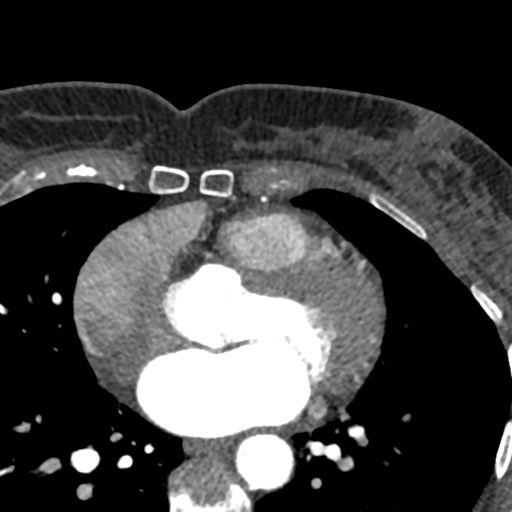

[Series 9: best diast · axial · 0.39mm/px · z∈[+1404,+1444]mm · 2 of 301 slices shown]
[im 101/301  vessel]
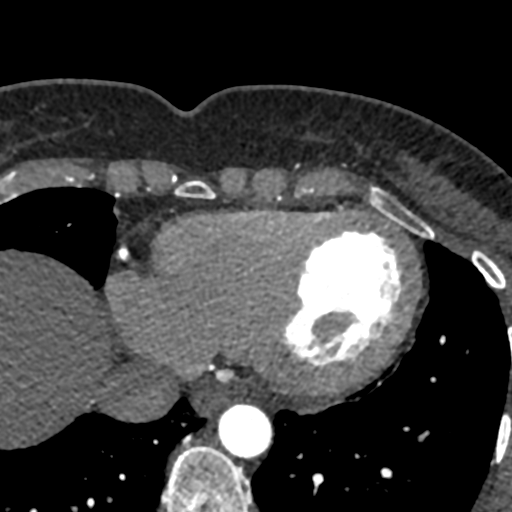
[im 201/301  vessel]
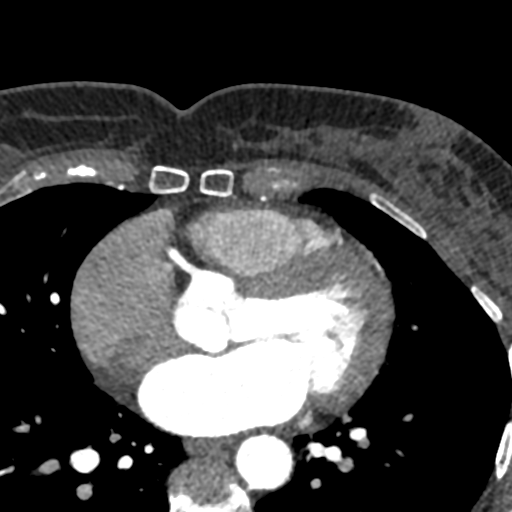

[8 of 20 positions shown; findings below may reference images not displayed]

FINDINGS: Vascular: The proximal ascending aorta measures up to 3.5 cm mildly
dilated but not aneurysmal. No large central pulmonary embolism is
seen within the partially visualized main and right pulmonary
artery.

Mediastinum/Nodes: No mediastinal or hilar lymphadenopathy is
visualized.

Lungs/Pleura: There is a lateral left lower lobe 4 mm nodule seen on
axial series 11, image 54. No pleural effusion or pneumothorax.

Upper Abdomen: No acute abnormality.

Musculoskeletal: Mild to moderate multilevel degenerative disc
changes and anterior endplate spurring of the visualized mid to
lower thoracic spine.
IMPRESSION: There is a lateral left lower lobe 4 mm pulmonary nodule. No
follow-up needed if patient is low-risk.This recommendation follows
the consensus statement: Guidelines for Management of Incidental
Pulmonary Nodules Detected on CT Images: From the [REDACTED]AL DATA:  Chest pain

EXAM:
Cardiac/Coronary  CTA
FINDINGS: A 100 kV prospective scan was triggered in the descending thoracic
aorta at 111 HU's. Axial non-contrast 3 mm slices were carried out
through the heart. The data set was analyzed on a dedicated work
station and scored using the Agatson method. Gantry rotation speed
was 250 msecs and collimation was .6 mm. 0.8 mg of sl NTG was given.
The 3D data set was reconstructed in 5% intervals of the 67-82 % of
the R-R cycle. Diastolic phases were analyzed on a dedicated work
station using MPR, MIP and VRT modes. The patient received 80 cc of
contrast.

Image quality: good

Aorta:  Normal size.  No calcifications.  No dissection.

Aortic Valve:  Trileaflet.  No calcifications.

Coronary Arteries:  Normal coronary origin.  Right dominance.

RCA is a large dominant artery that gives rise to PDA and PLA. There
is no plaque.

Left main is a large artery that gives rise to LAD and LCX arteries.

LAD is a large vessel that has no plaque.

LCX is a non-dominant artery that gives rise to one large OM1
branch. There is no plaque. Small caliber mid to distal vessel.

Other findings:

Normal pulmonary vein drainage into the left atrium.

Normal left atrial appendage without a thrombus.

Normal size of the pulmonary artery.

Please see radiology report for non cardiac findings.
IMPRESSION: 1. Coronary calcium score of 0.

2. Normal coronary origin with right dominance.

3. No evidence of CAD.

CAD-RADS 0. No evidence of CAD (0%). Consider non-atherosclerotic
causes of chest pain.

*** End of Addendum ***
EXAM:
OVER-READ INTERPRETATION  CT CHEST

The following report is an over-read performed by radiologist Dr.
RANA ADNAN [REDACTED] on [DATE]. This
over-read does not include interpretation of cardiac or coronary
anatomy or pathology. The coronary calcium score/coronary CTA
interpretation by the cardiologist is attached.
FINDINGS: Vascular: The proximal ascending aorta measures up to 3.5 cm mildly
dilated but not aneurysmal. No large central pulmonary embolism is
seen within the partially visualized main and right pulmonary
artery.

Mediastinum/Nodes: No mediastinal or hilar lymphadenopathy is
visualized.

Lungs/Pleura: There is a lateral left lower lobe 4 mm nodule seen on
axial series 11, image 54. No pleural effusion or pneumothorax.

Upper Abdomen: No acute abnormality.

Musculoskeletal: Mild to moderate multilevel degenerative disc
changes and anterior endplate spurring of the visualized mid to
lower thoracic spine.
IMPRESSION: There is a lateral left lower lobe 4 mm pulmonary nodule. No
follow-up needed if patient is low-risk.This recommendation follows
the consensus statement: Guidelines for Management of Incidental
Pulmonary Nodules Detected on CT Images: From the [HOSPITAL]

## 2022-03-09 MED ORDER — NITROGLYCERIN 0.4 MG SL SUBL
SUBLINGUAL_TABLET | SUBLINGUAL | Status: DC
Start: 2022-03-09 — End: 2022-03-10
  Filled 2022-03-09: qty 2

## 2022-03-09 MED ORDER — NITROGLYCERIN 0.4 MG SL SUBL
0.8000 mg | SUBLINGUAL_TABLET | Freq: Once | SUBLINGUAL | Status: AC
Start: 2022-03-09 — End: 2022-03-09
  Administered 2022-03-09: 0.8 mg via SUBLINGUAL

## 2022-03-09 MED ORDER — IOHEXOL 350 MG/ML SOLN
100.0000 mL | Freq: Once | INTRAVENOUS | Status: AC | PRN
Start: 2022-03-09 — End: 2022-03-09
  Administered 2022-03-09: 100 mL via INTRAVENOUS

## 2022-03-09 NOTE — Progress Notes (Signed)
Pt arrived today for ct heart scan. Upon brining her back to RN station for procedure prep, pt bp 190/129 and about 15 mins later, after she had rested in the recliner, it was 173/113. Pt reports that she came to the ED for chest pain and it was found that her blood pressure was elevated. She was prescribed norvasc but states that it makes her feel so bad she cannot take it.  ?I advised the importance of taking the medication but empathize that it doesn't make her feel well. I also advised that she should follow up with her primary care or cardiology so maybe she can be prescribed another bp medication. Pt verbalized understanding. After two nitroglycerin for CT scan bp 170/95.  ?Pt was extremely tearful and expressed that the stress of the situation was making things worse for her. I have informed Dr Lynnette Caffey via epic chat.  ?

## 2022-03-10 ENCOUNTER — Telehealth: Payer: Self-pay | Admitting: Internal Medicine

## 2022-03-10 NOTE — Telephone Encounter (Signed)
Patient is called for her test results.  ?

## 2022-03-11 NOTE — Telephone Encounter (Signed)
Left message for patient to call back. ?It looks like she was viewed her CT results after she called the office yesterday.   ?

## 2022-03-11 NOTE — Telephone Encounter (Signed)
Reviewed Coronary CTA results with patient.  No other questions/concerns.  She is pleased to hear. ?

## 2022-03-11 NOTE — Telephone Encounter (Signed)
Pt states she has questions and needs clarification on test results. Please advise ?

## 2023-01-18 ENCOUNTER — Ambulatory Visit
Admission: EM | Admit: 2023-01-18 | Discharge: 2023-01-18 | Disposition: A | Discharge status: Home / Self Care | Payer: 59 | Attending: Physician Assistant | Admitting: Physician Assistant

## 2023-01-18 ENCOUNTER — Encounter: Payer: Self-pay | Admitting: Emergency Medicine

## 2023-01-18 DIAGNOSIS — F419 Anxiety disorder, unspecified: Secondary | ICD-10-CM

## 2023-01-18 DIAGNOSIS — S81801A Unspecified open wound, right lower leg, initial encounter: Secondary | ICD-10-CM

## 2023-01-18 MED ORDER — HYDROXYZINE HCL 25 MG PO TABS: 25.0000 mg | ORAL_TABLET | Freq: Four times a day (QID) | ORAL | 0 refills | Status: AC | PRN

## 2023-01-18 NOTE — ED Provider Notes (Signed)
[facility]    CSN: [id number] Arrival date & time: 01/18/23  0800      History   Chief Complaint Chief Complaint  Patient presents with   Wound Check    HPI Misty Glover is a 47 y.o. female presenting for 8 day history of open wound of the right lower leg. She says she cut it on a bolt from a trailer last week and went to a different urgent care. She says they did not suture the wound and instead prescribed an antibiotic. She says the wound is not as deep as it was at onset. Denies any increase in pain and denies any significant pain. She has faint bruising around the wound. She has been cleaning the area and applying Neosporin but reports a lot of anxiety surrounding the healing and requests something to help with the anxiety. Has not had any fevers. No other complaints.   HPI  Past Medical History:  Diagnosis Date   Anemia    Chest pain     There are no problems to display for this patient.   Past Surgical History:  Procedure Laterality Date   CESAREAN SECTION     2    EYE SURGERY      OB History   No obstetric history on file.      Home Medications    Prior to Admission medications   Medication Sig Start Date End Date Taking? Authorizing Provider  hydrOXYzine (ATARAX) 25 MG tablet Take 1 tablet (25 mg total) by mouth every 6 (six) hours as needed for anxiety. 01/18/23  Yes Laurene Footman B, PA-C  amLODipine (NORVASC) 5 MG tablet Take 1 tablet (5 mg total) by mouth daily. 02/24/22   Early Osmond, MD  IRON CR PO Take by mouth.    [provider]  metoprolol tartrate (LOPRESSOR) 100 MG tablet Take 1 tablet (100 mg total) by mouth once for 1 dose. Take 90-120 minutes prior to scan. 02/24/22 02/24/22  Early Osmond, MD    Family History Family History  Problem Relation Age of Onset   Breast cancer Neg Hx     Social History Social History   Tobacco Use   Smoking status: Never   Smokeless tobacco: Never  Vaping Use   Vaping Use:  Never used  Substance Use Topics   Alcohol use: No   Drug use: No     Allergies   Zithromax [azithromycin]   Review of Systems Review of Systems  Constitutional:  Negative for fever.  Musculoskeletal:  Negative for arthralgias and joint swelling.  Skin:  Positive for color change and wound.  Neurological:  Negative for weakness and numbness.  Psychiatric/Behavioral:  The patient is nervous/anxious.      Physical Exam Triage Vital Signs ED Triage Vitals [01/18/23 0812]  Enc Vitals Group     BP      Pulse      Resp      Temp      Temp src      SpO2      Weight      Height      Head Circumference      Peak Flow      Pain Score 0     Pain Loc      Pain Edu?      Excl. in [place]?    No data found.  Updated Vital Signs BP (!) 179/127 (BP Location: Right Arm) Comment: Pt has stopped taking BP medication  Pulse 99   Temp 97.9 F (36.6 C) (Oral)   Resp 16   LMP  (LMP Unknown)   SpO2 96%      Physical Exam Vitals and nursing note reviewed.  Constitutional:      General: She is not in acute distress.    Appearance: Normal appearance. She is not ill-appearing or toxic-appearing.  HENT:     Head: Normocephalic and atraumatic.  Eyes:     General: No scleral icterus.       Right eye: No discharge.        Left eye: No discharge.     Conjunctiva/sclera: Conjunctivae normal.  Cardiovascular:     Rate and Rhythm: Normal rate and regular rhythm.     Pulses: Normal pulses.  Pulmonary:     Effort: Pulmonary effort is normal. No respiratory distress.  Musculoskeletal:     Cervical back: Neck supple.  Skin:    General: Skin is dry.     Findings: Bruising and wound present.     Comments: 2.5 cm x 2.5 cm "V-shaped" open wound right lower extremity with yellowish bruising around wound. Area mildly tender. No erythema, increased warmth, bleeding or pustular drainage.   Neurological:     General: No focal deficit present.     Mental Status: She is alert. Mental status is  at baseline.     Motor: No weakness.     Gait: Gait normal.  Psychiatric:        Mood and Affect: Mood is anxious.      UC Treatments / Results  Labs (all labs ordered are listed, but only abnormal results are displayed) Labs Reviewed - No data to display  EKG   Radiology No results found.  Procedures Procedures (including critical care time)  Medications Ordered in UC Medications - No data to display  Initial Impression / Assessment and Plan / UC Course  I have reviewed the triage vital signs and the nursing notes.  Pertinent labs & imaging results that were available during my care of the patient were reviewed by me and considered in my medical decision making (see chart for details).   47 y/o female presents for open wound of the right lower extremity for the past 8 days after injuring in on a trailer bolt. Patient shows me images of the wound at the time of the injury which is much deeper than it is at present. I have included an image in the chart of the wound at present. It does appear to be healing. There is one small area of darkened tissue in the corner of the wound. No signs of infection. Wound has been cleaned today, Bacitracin applied as well as non adherent pad and Coban by nursing staff. Sent a referral to wound care for patient and reviewed wound care guidelines. She requests something for anxiety so I sent hydroxyzine for her to take as needed. Return and ED precautions discussed.   Final Clinical Impressions(s) / UC Diagnoses   Final diagnoses:  Open wound of right lower leg, initial encounter  Anxiety     Discharge Instructions      -Clean the area with soap and water and apply small amount of Neosporin.  Cover with a nonadherent pad as we have done in the clinic today.  Do this at least once a day. - I placed a referral to wound care for you and you should receive a call in the next few days. - If at any point you  notice increased swelling, redness or  increased pain, return or go to ER. - The hydroxyzine medication sent to pharmacy should help with acute anxiety if needed.     ED Prescriptions     Medication Sig Dispense Auth. Provider   hydrOXYzine (ATARAX) 25 MG tablet Take 1 tablet (25 mg total) by mouth every 6 (six) hours as needed for anxiety. 30 tablet Gretta Cool      PDMP not reviewed this encounter.   Danton Clap, PA-C 01/18/23 [phone]

## 2023-01-18 NOTE — Discharge Instructions (Addendum)
-  Clean the area with soap and water and apply small amount of Neosporin.  Cover with a nonadherent pad as we have done in the clinic today.  Do this at least once a day. - I placed a referral to wound care for you and you should receive a call in the next few days. - If at any point you notice increased swelling, redness or increased pain, return or go to ER. - The hydroxyzine medication sent to pharmacy should help with acute anxiety if needed.

## 2023-01-18 NOTE — ED Triage Notes (Signed)
Pt had a puncture wound 1 week ago from a bolt from a trailer. The patient was seen at [facility] and antibiotic ointment was applied to the wound and it was dressed. Pt is concerned that it is not healing well.

## 2023-01-31 ENCOUNTER — Ambulatory Visit: Payer: Self-pay | Admitting: Physician Assistant

## 2023-08-29 ENCOUNTER — Ambulatory Visit: Payer: Self-pay | Admitting: Internal Medicine

## 2024-01-23 ENCOUNTER — Ambulatory Visit (LOCAL_COMMUNITY_HEALTH_CENTER): Payer: Self-pay

## 2024-01-23 DIAGNOSIS — Z111 Encounter for screening for respiratory tuberculosis: Secondary | ICD-10-CM

## 2024-01-23 NOTE — Progress Notes (Signed)
 In nurse clinic for TB screening. Patient provided documentation of history of +PPD and normal chest xray in 2012. TB screening form completed and given to patient. Copy sent for scanning.   Abagail Kitchens, RN

## 2024-01-26 ENCOUNTER — Other Ambulatory Visit: Payer: Self-pay
